# Patient Record
Sex: Female | Born: 1950 | Race: White | Hispanic: No | Marital: Married
Health system: Southern US, Community
[De-identification: ages and names within clinical notes are randomized; demographics above are authoritative.]

---

## 2018-04-15 ENCOUNTER — Other Ambulatory Visit (HOSPITAL_BASED_OUTPATIENT_CLINIC_OR_DEPARTMENT_OTHER): Payer: Self-pay | Admitting: Obstetrics and Gynecology

## 2018-04-15 DIAGNOSIS — Z1231 Encounter for screening mammogram for malignant neoplasm of breast: Secondary | ICD-10-CM

## 2018-04-23 ENCOUNTER — Ambulatory Visit (HOSPITAL_BASED_OUTPATIENT_CLINIC_OR_DEPARTMENT_OTHER)
Admission: RE | Admit: 2018-04-23 | Discharge: 2018-04-23 | Disposition: A | Discharge status: Home / Self Care | Payer: Medicare Other | Source: Ambulatory Visit | Attending: Obstetrics and Gynecology | Admitting: Obstetrics and Gynecology

## 2018-04-23 ENCOUNTER — Encounter (INDEPENDENT_AMBULATORY_CARE_PROVIDER_SITE_OTHER): Payer: Self-pay

## 2018-04-23 DIAGNOSIS — Z1231 Encounter for screening mammogram for malignant neoplasm of breast: Secondary | ICD-10-CM | POA: Diagnosis not present

## 2018-04-27 ENCOUNTER — Other Ambulatory Visit: Payer: Self-pay | Admitting: Obstetrics and Gynecology

## 2018-04-27 DIAGNOSIS — R928 Other abnormal and inconclusive findings on diagnostic imaging of breast: Secondary | ICD-10-CM

## 2018-05-03 ENCOUNTER — Other Ambulatory Visit: Payer: Medicare Other

## 2018-05-06 ENCOUNTER — Ambulatory Visit
Admission: RE | Admit: 2018-05-06 | Discharge: 2018-05-06 | Disposition: A | Discharge status: Home / Self Care | Payer: Medicare Other | Source: Ambulatory Visit | Attending: Obstetrics and Gynecology | Admitting: Obstetrics and Gynecology

## 2018-05-06 ENCOUNTER — Ambulatory Visit: Payer: Medicare Other

## 2018-05-06 DIAGNOSIS — R928 Other abnormal and inconclusive findings on diagnostic imaging of breast: Secondary | ICD-10-CM

## 2019-07-27 IMAGING — MG DIGITAL SCREENING BILATERAL MAMMOGRAM WITH TOMO AND CAD
8 series · 9 of 24 positions shown · non-contrast
Comparison: Previous exam(s).

CLINICAL DATA: Screening.

EXAM:
DIGITAL SCREENING BILATERAL MAMMOGRAM WITH TOMO AND CAD

[L CC synth-2D]
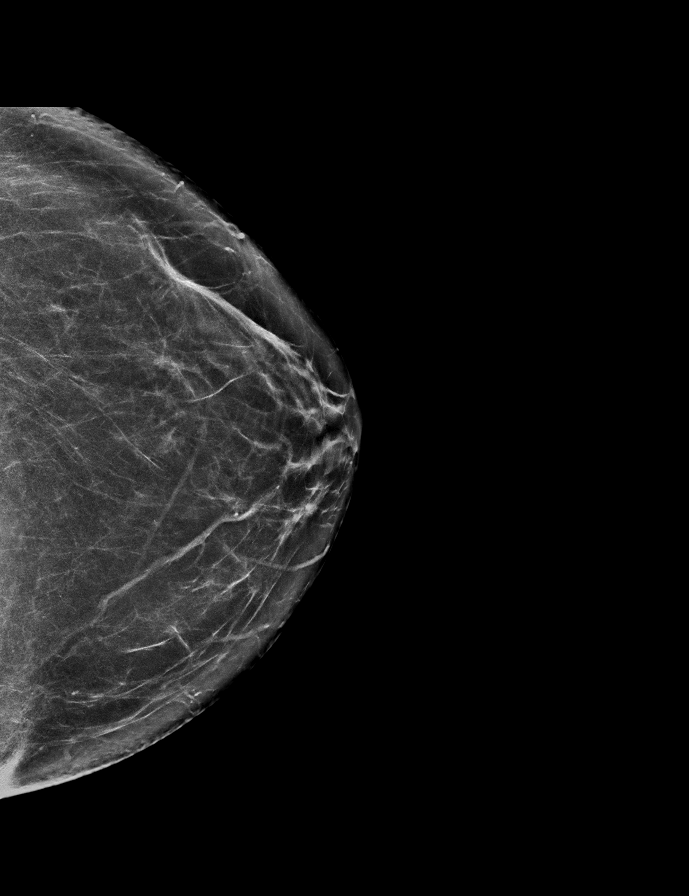

[R CC synth-2D]
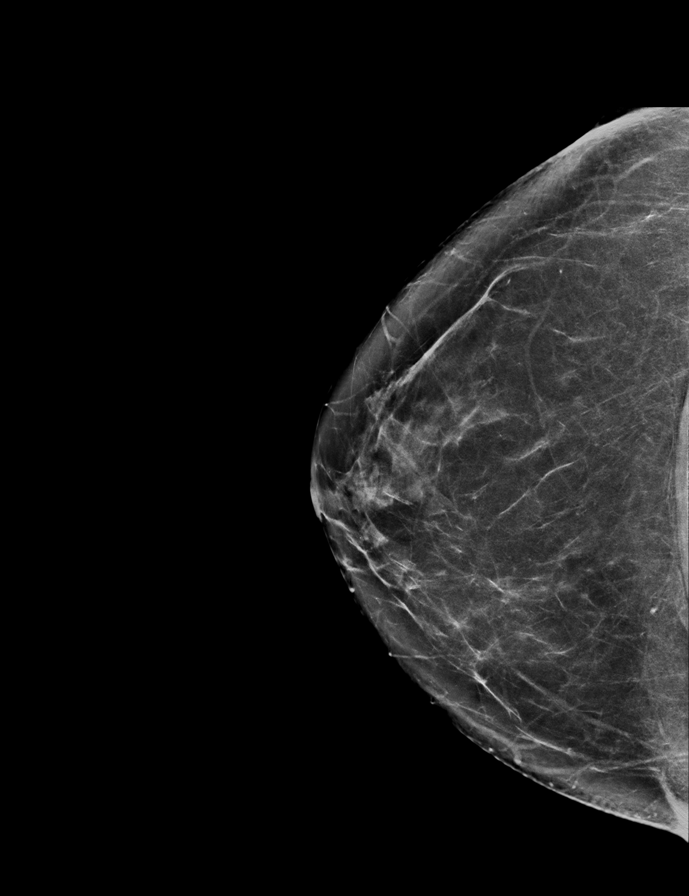

[L MLO synth-2D]
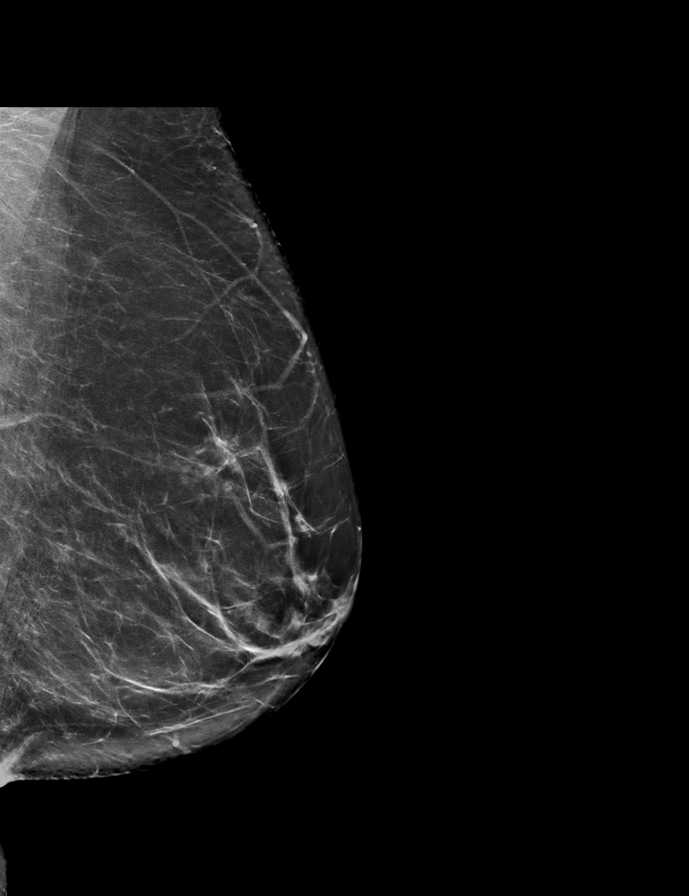

[R MLO synth-2D]
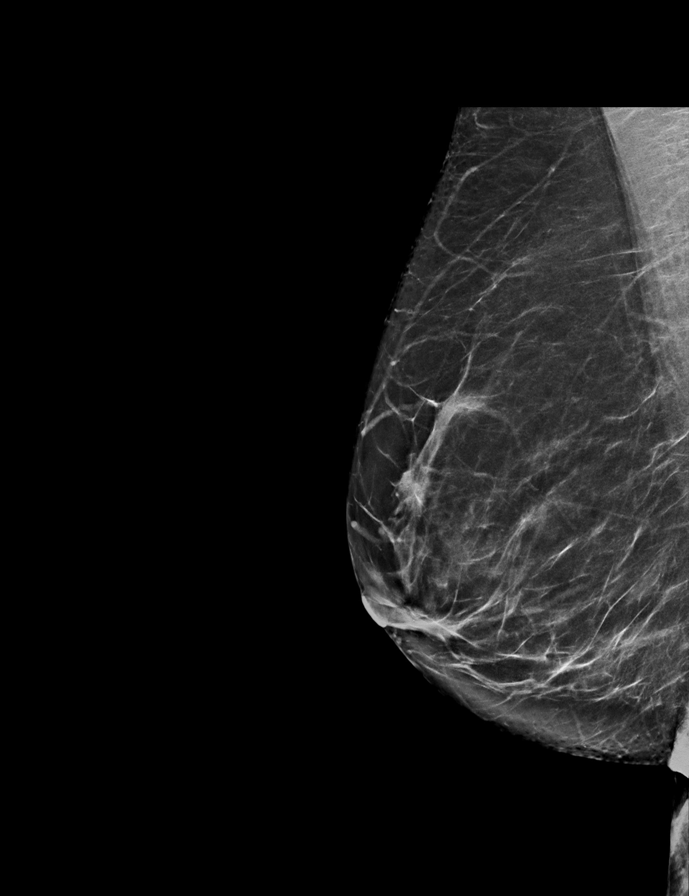

[L CC tomo · 2 of 68 frames shown]
[frame 22/68]
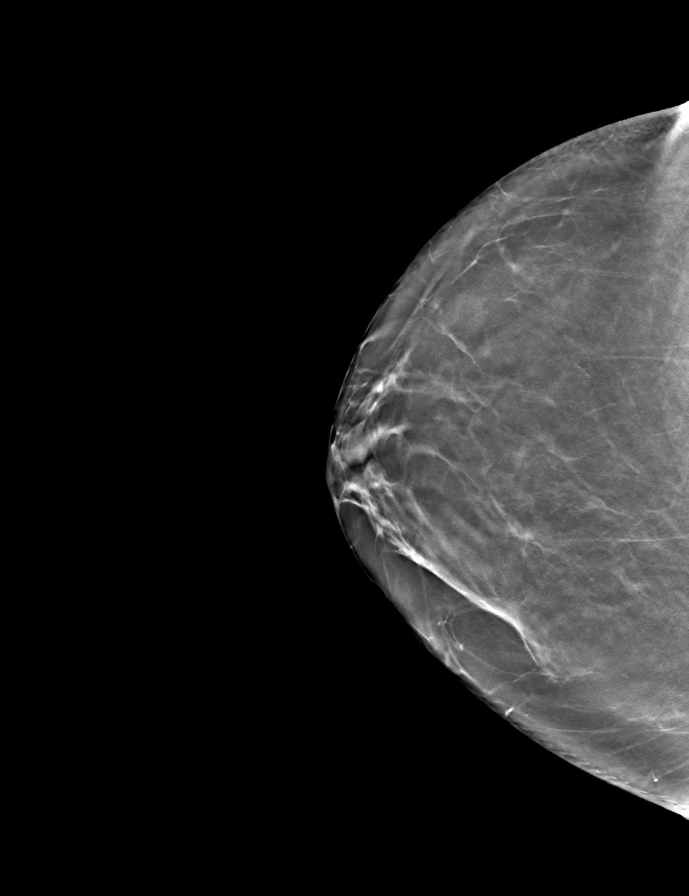
[frame 35/68]
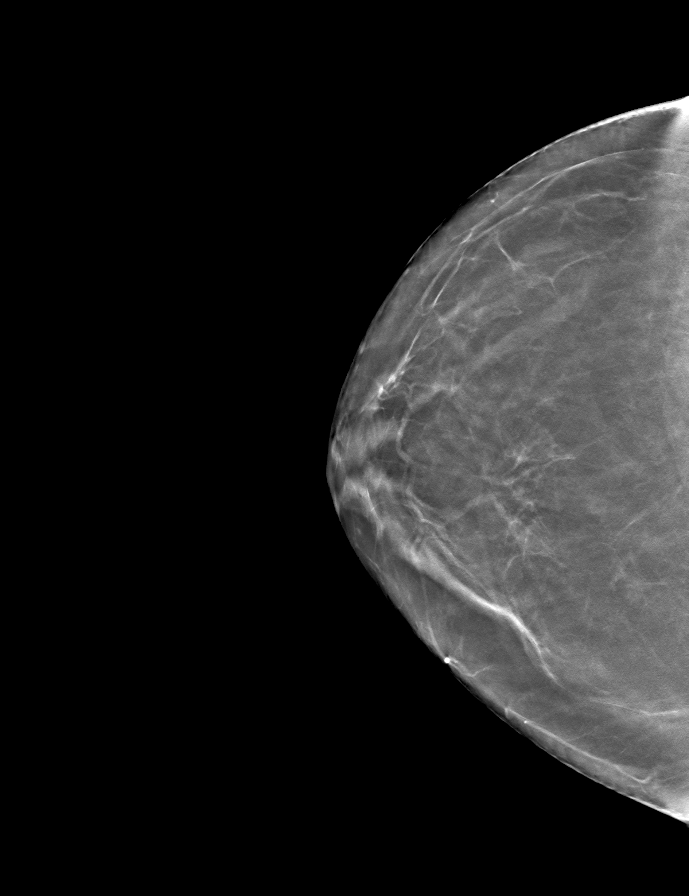

[R MLO tomo · tomo slice 35/70.0]
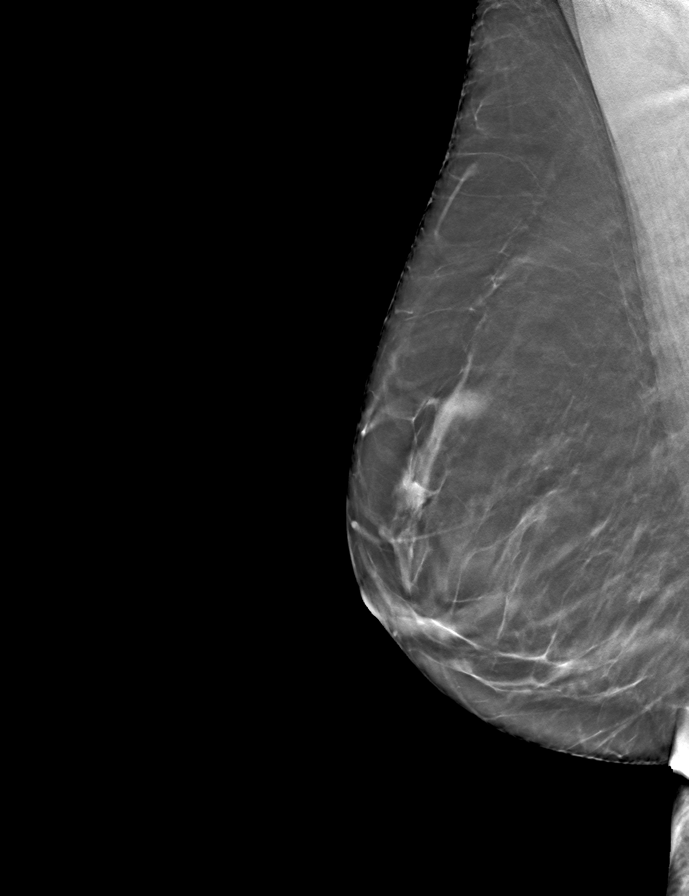

[L MLO tomo · tomo slice 36/71.0]
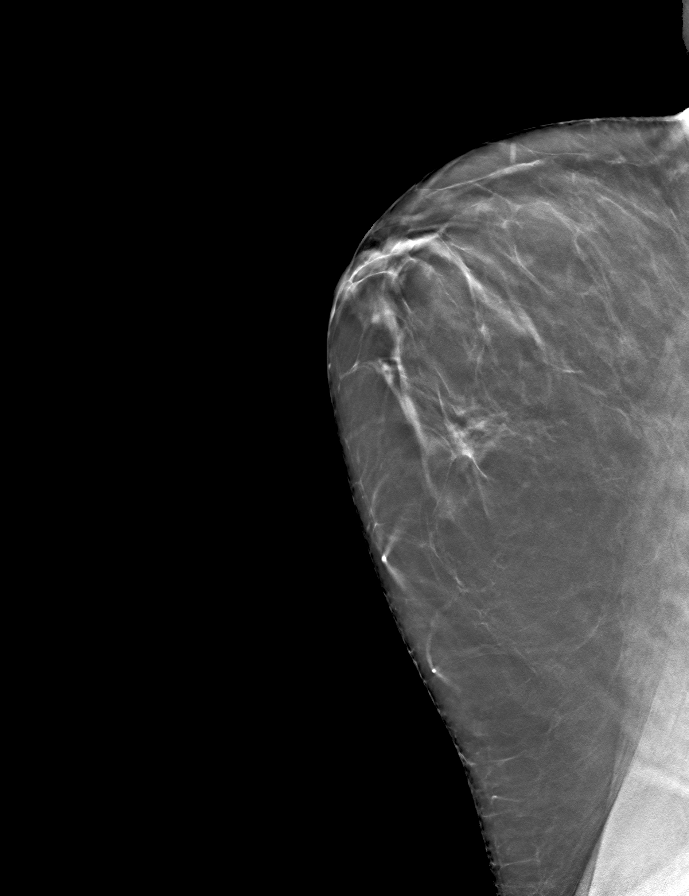

[R CC tomo · tomo slice 36/71.0]
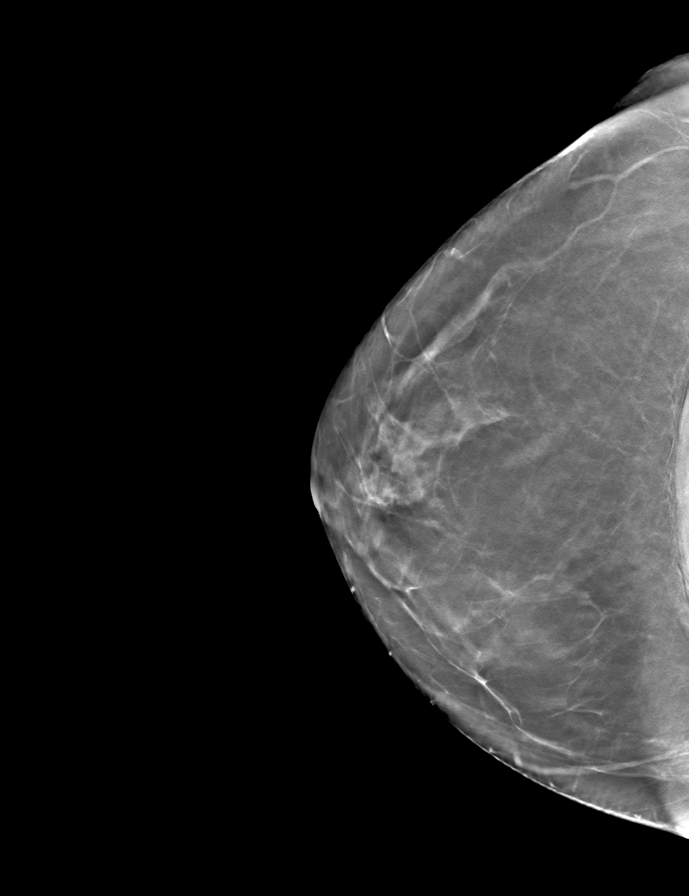

[9 of 24 positions shown; findings below may reference images not displayed]

ACR Breast Density Category b: There are scattered areas of
fibroglandular density.
FINDINGS: In the right breast, a possible mass warrants further evaluation. In
the left breast, no findings suspicious for malignancy. Images were
processed with CAD.
IMPRESSION: Further evaluation is suggested for possible mass in the right
breast.

RECOMMENDATION:
Diagnostic mammogram and possibly ultrasound of the right breast.
(Code:[id number]-550)

The patient will be contacted regarding the findings, and additional
imaging will be scheduled.

BI-RADS CATEGORY  0: Incomplete. Need additional imaging evaluation
and/or prior mammograms for comparison.

## 2019-08-05 ENCOUNTER — Ambulatory Visit: Payer: Medicare Other | Attending: Internal Medicine

## 2019-08-05 DIAGNOSIS — Z23 Encounter for immunization: Secondary | ICD-10-CM | POA: Insufficient documentation

## 2019-08-05 NOTE — Progress Notes (Signed)
   Covid-19 Vaccination Clinic  Name:  Katrina Bird    MRN: [id number] DOB: 1950-10-30  08/05/2019  [name] was observed post Covid-19 immunization for 15 minutes without incidence. She was provided with Vaccine Information Sheet and instruction to access the V-Safe system.   [name] was instructed to call 911 with any severe reactions post vaccine: Marland Kitchen Difficulty breathing  . Swelling of your face and throat  . A fast heartbeat  . A bad rash all over your body  . Dizziness and weakness    Immunizations Administered    Name Date Dose VIS Date Route   Pfizer COVID-19 Vaccine 08/05/2019  5:37 PM 0.3 mL 06/09/2019 Intramuscular   Manufacturer: [employer], [employer]   Lot: [id number]   NDC: [address]-6

## 2019-08-23 ENCOUNTER — Ambulatory Visit: Payer: Medicare Other

## 2019-08-30 ENCOUNTER — Ambulatory Visit: Payer: Medicare Other | Attending: Internal Medicine

## 2019-08-30 DIAGNOSIS — Z23 Encounter for immunization: Secondary | ICD-10-CM | POA: Insufficient documentation

## 2019-08-30 NOTE — Progress Notes (Signed)
   Covid-19 Vaccination Clinic  Name:  Katrina Bird    MRN: [id number] DOB: 05-19-1951  08/30/2019  Katrina Bird was observed post Covid-19 immunization for 15 minutes without incident. She was provided with Vaccine Information Sheet and instruction to access the V-Safe system.   Katrina Bird was instructed to call 911 with any severe reactions post vaccine: Marland Kitchen Difficulty breathing  . Swelling of face and throat  . A fast heartbeat  . A bad rash all over body  . Dizziness and weakness   Immunizations Administered    Name Date Dose VIS Date Route   Pfizer COVID-19 Vaccine 08/30/2019  2:27 PM 0.3 mL 06/09/2019 Intramuscular   Manufacturer: [employer], [employer]   Lot: [id number]   NDC: [address]-0

## 2020-04-02 ENCOUNTER — Ambulatory Visit: Payer: Medicare Other | Attending: Internal Medicine

## 2020-04-02 ENCOUNTER — Other Ambulatory Visit (HOSPITAL_BASED_OUTPATIENT_CLINIC_OR_DEPARTMENT_OTHER): Payer: Self-pay | Admitting: Internal Medicine

## 2020-04-02 DIAGNOSIS — Z23 Encounter for immunization: Secondary | ICD-10-CM

## 2020-04-02 NOTE — Progress Notes (Signed)
   Covid-19 Vaccination Clinic  Name:  Katrina Bird    MRN: [id number] DOB: [birth year]  04/02/2020  [name] was observed post Covid-19 immunization for 15 minutes without incident. She was provided with Vaccine Information Sheet and instruction to access the V-Safe system.  Vaccinated by Fredirick Maudlin  [name] was instructed to call 911 with any severe reactions post vaccine: Marland Kitchen Difficulty breathing  . Swelling of face and throat  . A fast heartbeat  . A bad rash all over body  . Dizziness and weakness
